# Patient Record
Sex: Female | Born: 1965 | Race: White | Hispanic: No | Marital: Married | Smoking: Never smoker
Health system: Southern US, Community
[De-identification: ages and names within clinical notes are randomized; demographics above are authoritative.]

## PROBLEM LIST (undated history)

## (undated) DIAGNOSIS — I10 Essential (primary) hypertension: Secondary | ICD-10-CM

---

## 2009-10-23 ENCOUNTER — Ambulatory Visit: Payer: Self-pay | Admitting: Family Medicine

## 2011-03-18 ENCOUNTER — Ambulatory Visit: Payer: Self-pay | Admitting: Family Medicine

## 2013-02-08 ENCOUNTER — Ambulatory Visit: Payer: Self-pay | Admitting: Internal Medicine

## 2013-06-10 DIAGNOSIS — E7800 Pure hypercholesterolemia, unspecified: Secondary | ICD-10-CM | POA: Insufficient documentation

## 2013-06-10 DIAGNOSIS — E559 Vitamin D deficiency, unspecified: Secondary | ICD-10-CM | POA: Insufficient documentation

## 2013-08-02 ENCOUNTER — Ambulatory Visit: Payer: Self-pay | Admitting: Family Medicine

## 2013-08-30 ENCOUNTER — Ambulatory Visit: Payer: Self-pay | Admitting: Physician Assistant

## 2013-08-30 LAB — BASIC METABOLIC PANEL
ANION GAP: 9 (ref 7–16)
BUN: 13 mg/dL (ref 7–18)
CHLORIDE: 104 mmol/L (ref 98–107)
CREATININE: 0.83 mg/dL (ref 0.60–1.30)
Calcium, Total: 9.3 mg/dL (ref 8.5–10.1)
Co2: 26 mmol/L (ref 21–32)
EGFR (African American): >60
Glucose: 104 mg/dL — ABNORMAL HIGH (ref 65–99)
OSMOLALITY: 278 (ref 275–301)
Potassium: 4.1 mmol/L (ref 3.5–5.1)
Sodium: 139 mmol/L (ref 136–145)

## 2016-04-29 ENCOUNTER — Other Ambulatory Visit: Payer: Self-pay | Admitting: Family Medicine

## 2016-04-29 DIAGNOSIS — Z1231 Encounter for screening mammogram for malignant neoplasm of breast: Secondary | ICD-10-CM

## 2016-05-06 ENCOUNTER — Ambulatory Visit
Admission: RE | Admit: 2016-05-06 | Discharge: 2016-05-06 | Disposition: A | Discharge status: Home / Self Care | Payer: Managed Care, Other (non HMO) | Source: Ambulatory Visit | Attending: Family Medicine | Admitting: Family Medicine

## 2016-05-06 ENCOUNTER — Encounter: Payer: Self-pay | Admitting: Radiology

## 2016-05-06 DIAGNOSIS — Z1231 Encounter for screening mammogram for malignant neoplasm of breast: Secondary | ICD-10-CM | POA: Insufficient documentation

## 2017-05-28 ENCOUNTER — Other Ambulatory Visit: Payer: Self-pay | Admitting: Family Medicine

## 2017-05-28 DIAGNOSIS — Z1231 Encounter for screening mammogram for malignant neoplasm of breast: Secondary | ICD-10-CM

## 2017-06-18 ENCOUNTER — Ambulatory Visit
Admission: RE | Admit: 2017-06-18 | Discharge: 2017-06-18 | Disposition: A | Discharge status: Home / Self Care | Payer: Managed Care, Other (non HMO) | Source: Ambulatory Visit | Attending: Family Medicine | Admitting: Family Medicine

## 2017-06-18 DIAGNOSIS — Z1231 Encounter for screening mammogram for malignant neoplasm of breast: Secondary | ICD-10-CM | POA: Insufficient documentation

## 2017-06-23 ENCOUNTER — Other Ambulatory Visit: Payer: Self-pay | Admitting: Family Medicine

## 2017-06-23 DIAGNOSIS — R928 Other abnormal and inconclusive findings on diagnostic imaging of breast: Secondary | ICD-10-CM

## 2017-06-23 DIAGNOSIS — N6310 Unspecified lump in the right breast, unspecified quadrant: Secondary | ICD-10-CM

## 2017-06-26 ENCOUNTER — Ambulatory Visit: Payer: Managed Care, Other (non HMO)

## 2017-06-27 DIAGNOSIS — I10 Essential (primary) hypertension: Secondary | ICD-10-CM | POA: Insufficient documentation

## 2017-07-02 ENCOUNTER — Ambulatory Visit
Admission: RE | Admit: 2017-07-02 | Discharge: 2017-07-02 | Disposition: A | Discharge status: Home / Self Care | Payer: Managed Care, Other (non HMO) | Source: Ambulatory Visit | Attending: Family Medicine | Admitting: Family Medicine

## 2017-07-02 ENCOUNTER — Ambulatory Visit: Payer: Managed Care, Other (non HMO)

## 2017-07-02 ENCOUNTER — Other Ambulatory Visit: Payer: Managed Care, Other (non HMO)

## 2017-07-02 DIAGNOSIS — N6311 Unspecified lump in the right breast, upper outer quadrant: Secondary | ICD-10-CM | POA: Diagnosis not present

## 2017-07-02 DIAGNOSIS — N6310 Unspecified lump in the right breast, unspecified quadrant: Secondary | ICD-10-CM

## 2017-07-02 DIAGNOSIS — R928 Other abnormal and inconclusive findings on diagnostic imaging of breast: Secondary | ICD-10-CM | POA: Insufficient documentation

## 2017-08-07 ENCOUNTER — Other Ambulatory Visit: Payer: Self-pay

## 2017-08-07 ENCOUNTER — Encounter: Payer: Self-pay | Admitting: Emergency Medicine

## 2017-08-07 ENCOUNTER — Ambulatory Visit
Admission: EM | Admit: 2017-08-07 | Discharge: 2017-08-07 | Disposition: A | Discharge status: Home / Self Care | Payer: Managed Care, Other (non HMO) | Attending: Family Medicine | Admitting: Family Medicine

## 2017-08-07 DIAGNOSIS — R21 Rash and other nonspecific skin eruption: Secondary | ICD-10-CM | POA: Diagnosis not present

## 2017-08-07 HISTORY — DX: Essential (primary) hypertension: I10

## 2017-08-07 MED ORDER — DOXYCYCLINE HYCLATE 100 MG PO CAPS: 100.0000 mg | ORAL_CAPSULE | Freq: Two times a day (BID) | ORAL | 0 refills | Status: DC

## 2017-08-07 MED ORDER — MUPIROCIN 2 % EX OINT: 1.0000 "application " | TOPICAL_OINTMENT | Freq: Two times a day (BID) | CUTANEOUS | 0 refills | Status: AC

## 2017-08-07 MED ORDER — MUPIROCIN 2 % EX OINT: 1.0000 "application " | TOPICAL_OINTMENT | Freq: Two times a day (BID) | CUTANEOUS | 0 refills | Status: DC

## 2017-08-07 NOTE — ED Triage Notes (Signed)
Patient states that she was peeling shrimp 2 days ago a poked her left thumb with the shell of the shrimp.  Patient states today that she noticed a red rash on her left thumb.  Patient denies pain or itching.

## 2017-08-07 NOTE — Discharge Instructions (Signed)
This appears to be a local reaction.  Could be early cellulitis.  Apply the topical agent. If no improvement, start the Doxy.  Take care  Dr. Adriana Simasook

## 2017-08-07 NOTE — ED Provider Notes (Signed)
[facility]    CSN: [id number] Arrival date & time: 08/07/17  1942   History   Chief Complaint Chief Complaint  Patient presents with  . Rash   HPI  52 year old female presents with the above complaint.  Patient reports that 2 days ago she was peeling shrimp.  She states that she inadvertently poked her left thumb with some portion of the shrimp.  She states that earlier today, she noticed redness of the thumb at the site.  No drainage.  She denies any warmth or pain.  She states that her husband thought she should be evaluated.  Additionally, her dentist thought that she should be evaluated for potential cellulitis.  No fevers or chills.  No known exacerbating relieving factors.  No other complaints at this time.  PMH: Essential hypertension 06/27/2017  Vitamin D deficiency, unspecified 06/10/2013  Pure hypercholesterolemia    Past Surgical History:  Procedure Laterality Date  . CESAREAN SECTION     OB History    No data available     Family History Family History  Problem Relation Age of Onset  . Breast cancer Paternal Aunt 1750       2 pat aunts   Social History Social History   Tobacco Use  . Smoking status: Never Smoker  . Smokeless tobacco: Never Used  Substance Use Topics  . Alcohol use: Yes  . Drug use: Not on file    Allergies   Codeine  Review of Systems Review of Systems  Constitutional: Negative.   Skin: Positive for rash.       Warmth, no pain.   Physical Exam Triage Vital Signs ED Triage Vitals  Enc Vitals Group     BP 08/07/17 1957 133/84     Pulse Rate 08/07/17 1957 67     Resp --      Temp 08/07/17 1957 97.7 F (36.5 C)     Temp Source 08/07/17 1957 Oral     SpO2 08/07/17 1957 100 %     Weight 08/07/17 1953 155 lb (70.3 kg)     Height 08/07/17 1953 5\' 6"  (1.676 m)     Head Circumference --      Peak Flow --      Pain Score 08/07/17 1954 0     Pain Loc --      Pain Edu? --      Excl. in [place]? --    Updated Vital  Signs BP 133/84 (BP Location: Left Arm)   Pulse 67   Temp 97.7 F (36.5 C) (Oral)   Ht 5\' 6"  (1.676 m)   Wt 155 lb (70.3 kg)   LMP 07/31/2017   SpO2 100%   BMI 25.02 kg/m      Physical Exam  Constitutional: She is oriented to person, place, and time. She appears well-developed and well-nourished. No distress.  Pulmonary/Chest: Effort normal. No respiratory distress.  Musculoskeletal:  Left thumb: Normal range of motion.  Erythema noted.  No edema.  No tenderness.  Neurological: She is alert and oriented to person, place, and time.  Skin:  Left thumb -small punctate area noted from injury.  Mild surrounding erythema.  No blanching.  No warmth.  No tenderness.  No drainage.  Psychiatric: She has a normal mood and affect. Her behavior is normal.  Nursing note and vitals reviewed.  UC Treatments / Results  Labs (all labs ordered are listed, but only abnormal results are displayed) Labs Reviewed - No data to display  EKG  EKG Interpretation None       Radiology No results found.  Procedures Procedures (including critical care time)  Medications Ordered in UC Medications - No data to display   Initial Impression / Assessment and Plan / UC Course  I have reviewed the triage vital signs and the nursing notes.  Pertinent labs & imaging results that were available during my care of the patient were reviewed by me and considered in my medical decision making (see chart for details).     52 year old female presents with what appears to be a local reaction.  She is concerned about cellulitis.  Trial of Bactroban ointment.  If no improvement, she can proceed with doxycycline that was given.  Final Clinical Impressions(s) / UC Diagnoses   Final diagnoses:  Rash    ED Discharge Orders        Ordered    mupirocin ointment (BACTROBAN) 2 %  2 times daily,   Status:  Discontinued     08/07/17 2005    doxycycline (VIBRAMYCIN) 100 MG capsule  2 times daily     08/07/17  2005    mupirocin ointment (BACTROBAN) 2 %  2 times daily     08/07/17 2007     Controlled Substance Prescriptions [place] Controlled Substance Registry consulted? Not Applicable   Tommie Sams, DO 08/07/17 2013

## 2017-08-10 ENCOUNTER — Telehealth: Payer: Self-pay

## 2017-08-10 NOTE — Telephone Encounter (Signed)
Called to follow up with patient since [facility]. Patient reports resolvement of rash.  Patient instructed to call back with any questions or concerns. [facility]

## 2017-12-01 ENCOUNTER — Other Ambulatory Visit: Payer: Self-pay | Admitting: Family Medicine

## 2017-12-01 DIAGNOSIS — R928 Other abnormal and inconclusive findings on diagnostic imaging of breast: Secondary | ICD-10-CM

## 2018-01-05 ENCOUNTER — Ambulatory Visit
Admission: RE | Admit: 2018-01-05 | Discharge: 2018-01-05 | Disposition: A | Discharge status: Home / Self Care | Payer: Managed Care, Other (non HMO) | Source: Ambulatory Visit | Attending: Family Medicine | Admitting: Family Medicine

## 2018-01-05 DIAGNOSIS — R928 Other abnormal and inconclusive findings on diagnostic imaging of breast: Secondary | ICD-10-CM | POA: Insufficient documentation

## 2018-01-19 ENCOUNTER — Other Ambulatory Visit: Payer: Self-pay | Admitting: Family Medicine

## 2018-01-19 DIAGNOSIS — R928 Other abnormal and inconclusive findings on diagnostic imaging of breast: Secondary | ICD-10-CM

## 2018-07-01 ENCOUNTER — Other Ambulatory Visit: Payer: Self-pay | Admitting: Family Medicine

## 2018-07-01 DIAGNOSIS — R928 Other abnormal and inconclusive findings on diagnostic imaging of breast: Secondary | ICD-10-CM

## 2018-08-17 ENCOUNTER — Ambulatory Visit
Admission: RE | Admit: 2018-08-17 | Discharge: 2018-08-17 | Disposition: A | Discharge status: Home / Self Care | Payer: Managed Care, Other (non HMO) | Source: Ambulatory Visit | Attending: Family Medicine | Admitting: Family Medicine

## 2018-08-17 DIAGNOSIS — R928 Other abnormal and inconclusive findings on diagnostic imaging of breast: Secondary | ICD-10-CM

## 2019-01-18 IMAGING — US US BREAST*R* LIMITED INC AXILLA
1 series · 5 of 5 positions shown · non-contrast
Comparison: Previous exam(s).

CLINICAL DATA: 51-year-old female recalled from screening mammogram
dated 06/18/2017 for a possible right breast mass.

EXAM:
2D DIGITAL DIAGNOSTIC RIGHT MAMMOGRAM WITH CAD AND ADJUNCT TOMO
ULTRASOUND RIGHT BREAST

[Series 1: us breast*right* limited inc axilla · 0.06mm/px · 5 of 5 slices shown]
[im 1/5]
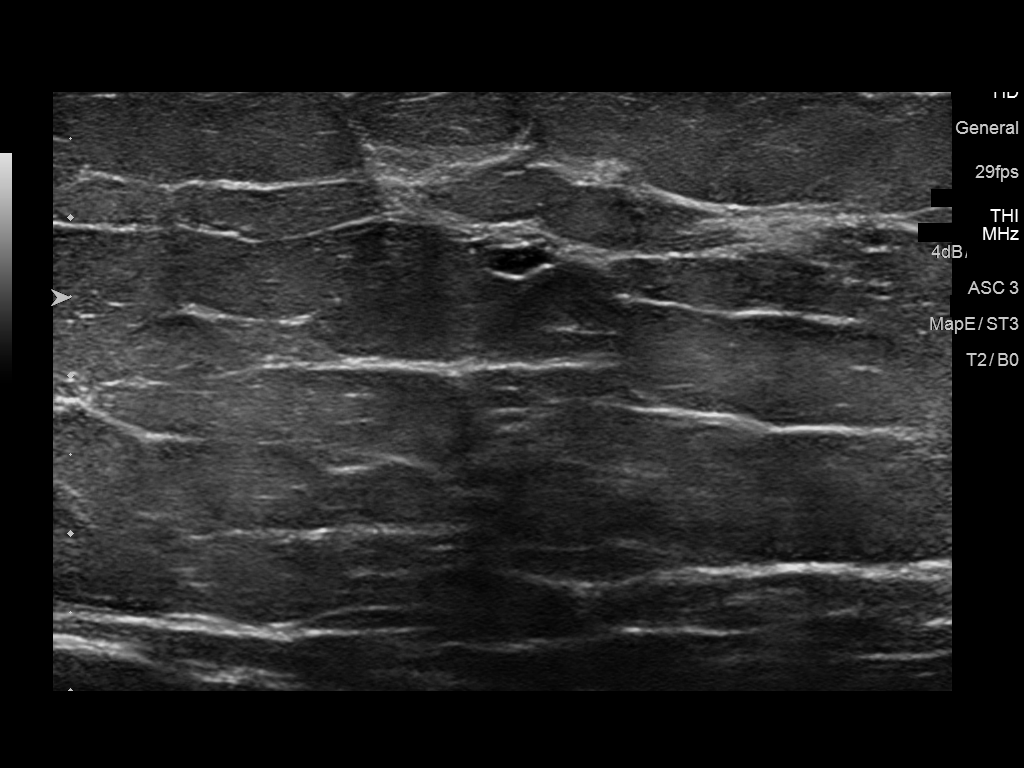
[im 2/5]
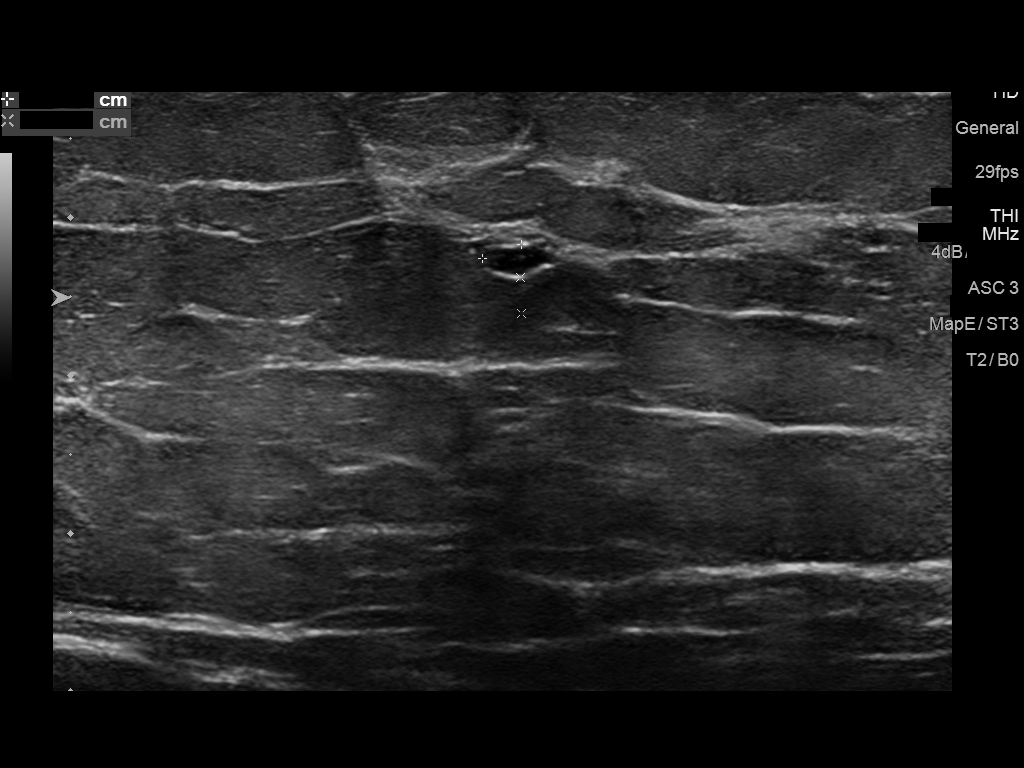
[im 3/5]
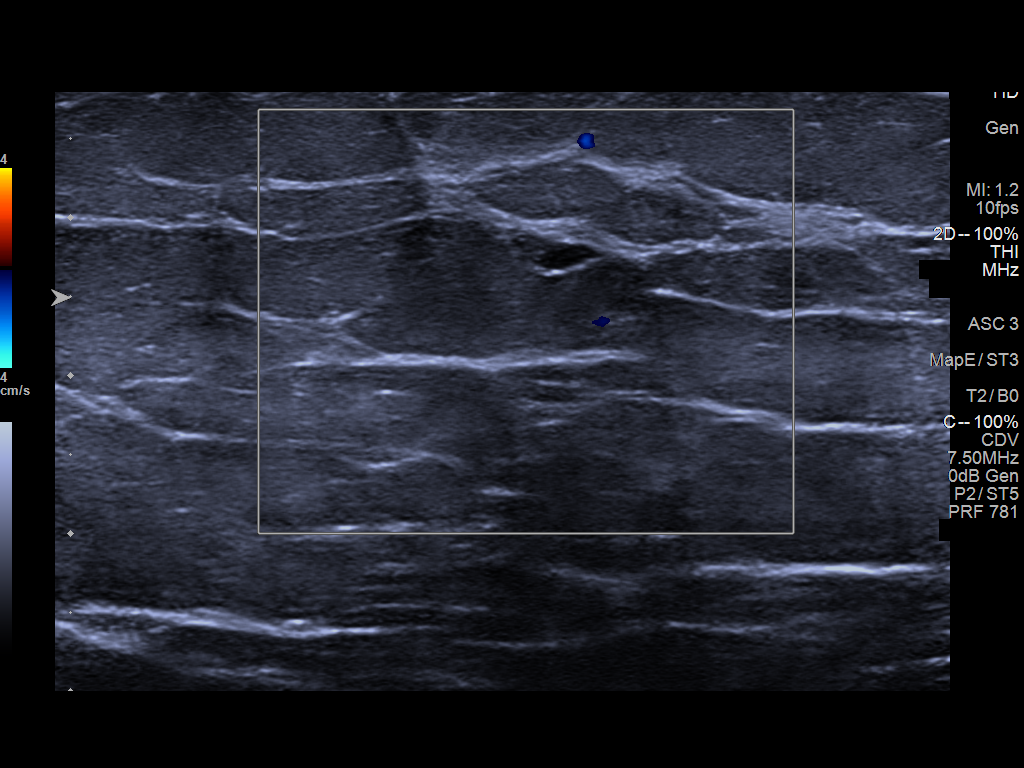
[im 4/5]
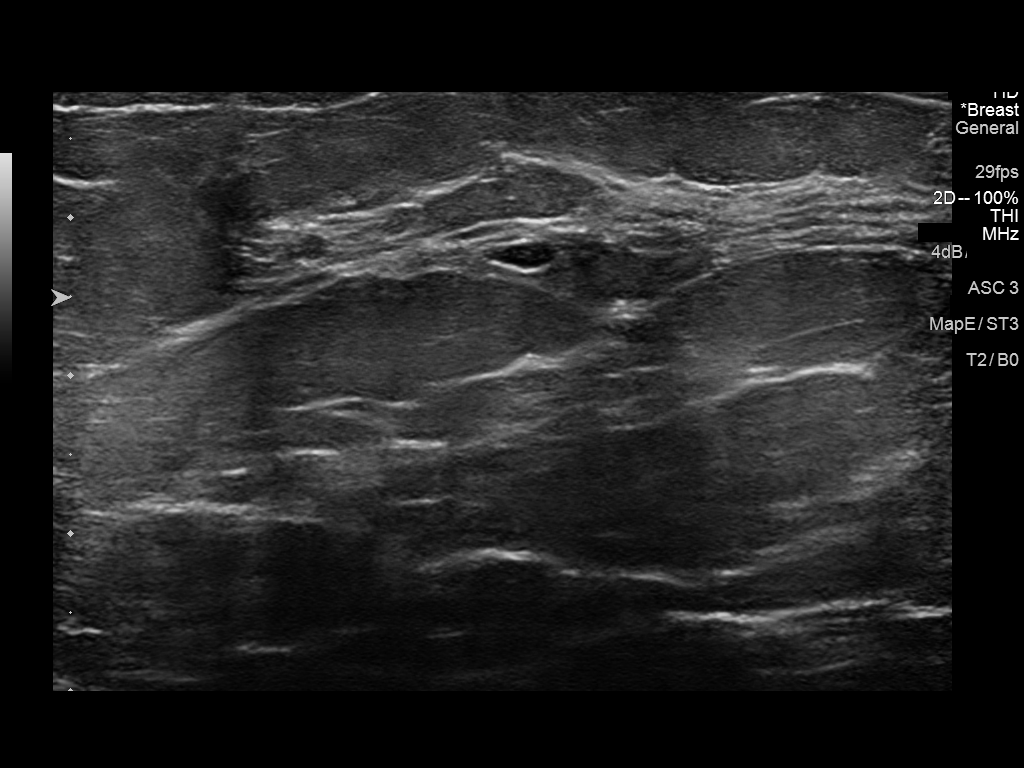
[im 5/5]
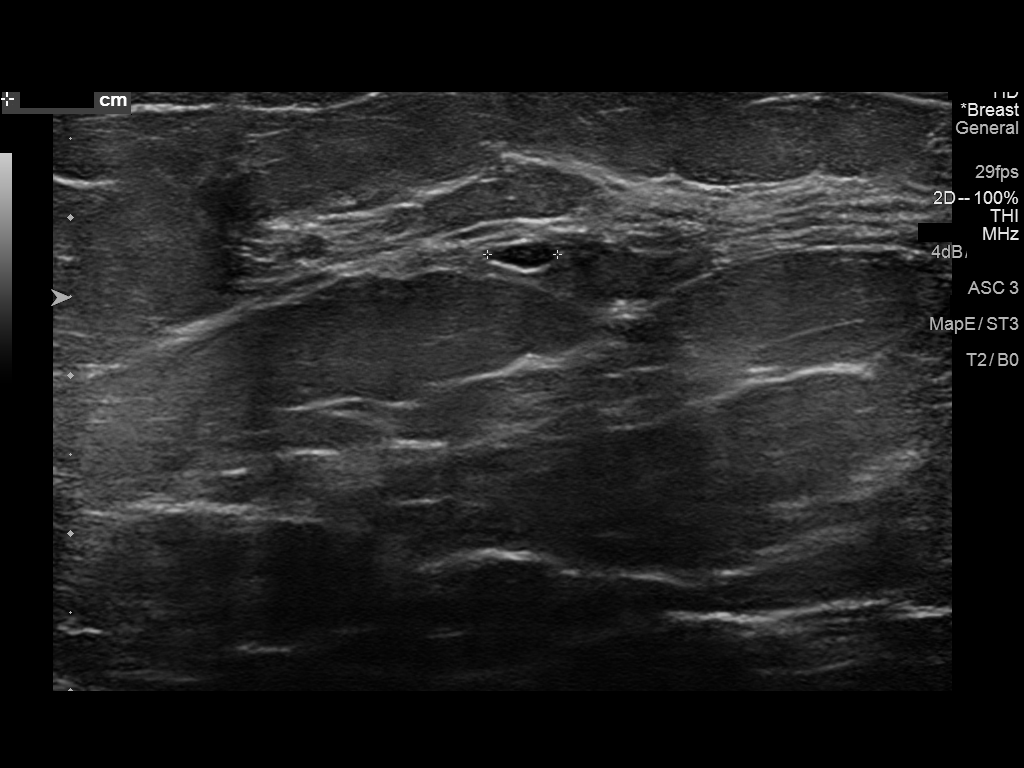

[5 of 5 positions shown; findings below may reference images not displayed]

ACR Breast Density Category b: There are scattered areas of
fibroglandular density.
FINDINGS: Previously described mass in the upper outer right breast at middle
depth persists on today's additional 3D views. It appears
circumscribed with gentle lobulations and is equal in density. Other
stable similar-appearing masses are identified. Further evaluation
with ultrasound was performed.

Mammographic images were processed with CAD.

Targeted ultrasound is performed, showing a circumscribed nearly
anechoic oval mass at the [DATE] position 4 cm from the nipple. It
measures 0.4 x 0.5 x 0.3 cm. There is no significant internal
vascularity. This may correspond with the mammographic finding.
Extensive sonographic evaluation of the entire lateral right breast
was performed. Multiple other similar appearing simple and minimally
complicated cysts were identified.

Please note, sonographic images 1-3 are incorrectly labeled as
radial. These images were taken in the anti-radial orientation.
IMPRESSION: Probably benign probable right breast minimally complicated cyst.
Recommendation is for six-month mammographic and sonographic
follow-up.

RECOMMENDATION:
Diagnostic right breast mammogram and ultrasound in 6 months.

I have discussed the findings and recommendations with the patient.
Results were also provided in writing at the conclusion of the
visit. If applicable, a reminder letter will be sent to the patient
regarding the next appointment.

BI-RADS CATEGORY  3: Probably benign.

## 2019-10-18 DIAGNOSIS — Z83719 Family history of colon polyps, unspecified: Secondary | ICD-10-CM | POA: Insufficient documentation

## 2019-10-19 ENCOUNTER — Other Ambulatory Visit: Payer: Self-pay | Admitting: Family Medicine

## 2019-10-19 DIAGNOSIS — Z1231 Encounter for screening mammogram for malignant neoplasm of breast: Secondary | ICD-10-CM

## 2020-01-10 DIAGNOSIS — M7711 Lateral epicondylitis, right elbow: Secondary | ICD-10-CM | POA: Insufficient documentation

## 2020-01-10 DIAGNOSIS — G5602 Carpal tunnel syndrome, left upper limb: Secondary | ICD-10-CM | POA: Insufficient documentation

## 2020-01-10 DIAGNOSIS — G5601 Carpal tunnel syndrome, right upper limb: Secondary | ICD-10-CM | POA: Insufficient documentation

## 2020-11-18 IMAGING — US ULTRASOUND RIGHT BREAST LIMITED
1 series · 4 of 4 positions shown · non-contrast
Comparison: Previous exam(s).

CLINICAL DATA: Follow-up right breast mass

EXAM:
DIGITAL DIAGNOSTIC BILATERAL MAMMOGRAM WITH CAD AND TOMO
ULTRASOUND RIGHT BREAST

[Series 1: ultrasound right breast limited · 0.05mm/px · 4 of 4 slices shown]
[im 1/4]
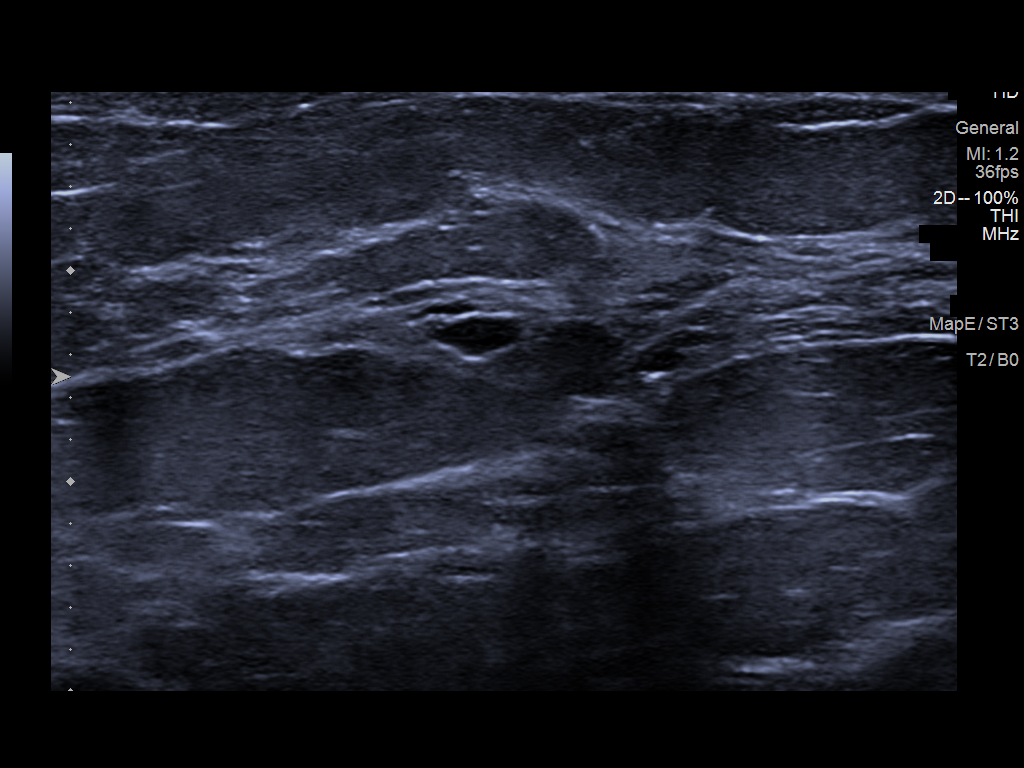
[im 2/4]
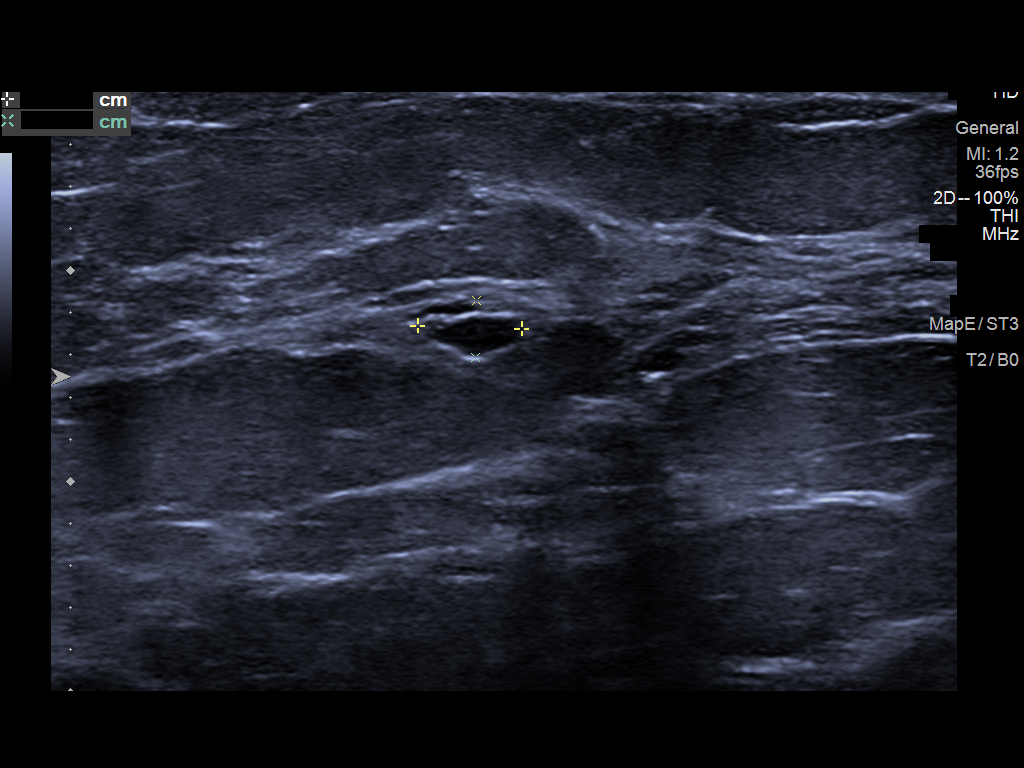
[im 3/4]
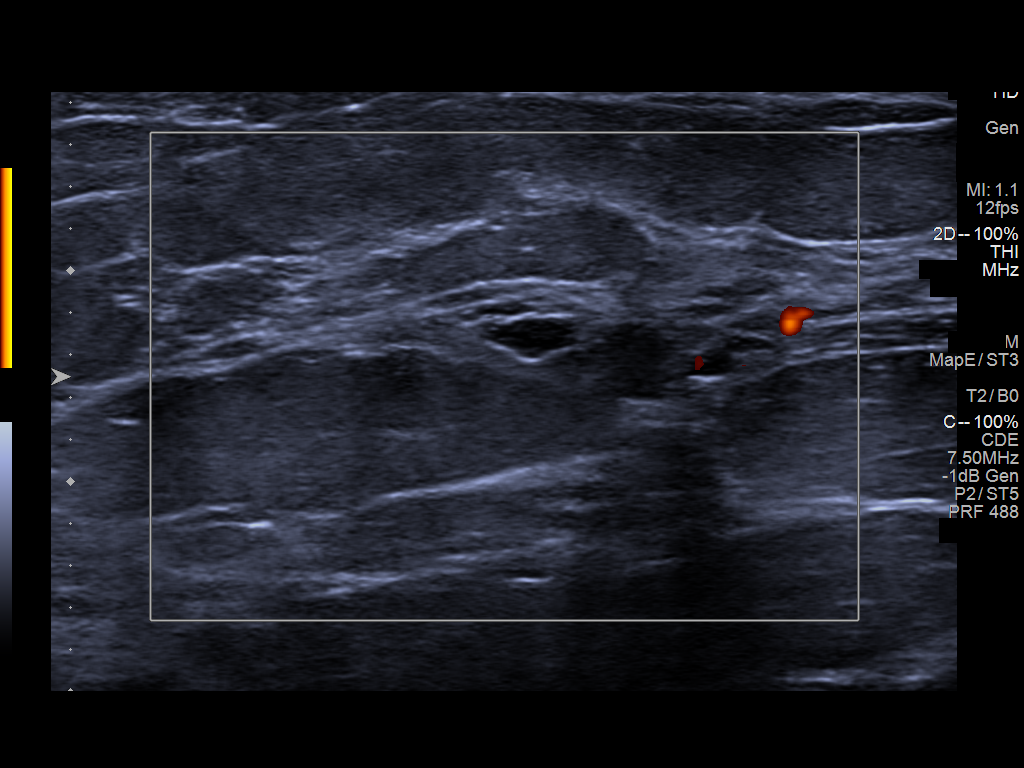
[im 4/4]
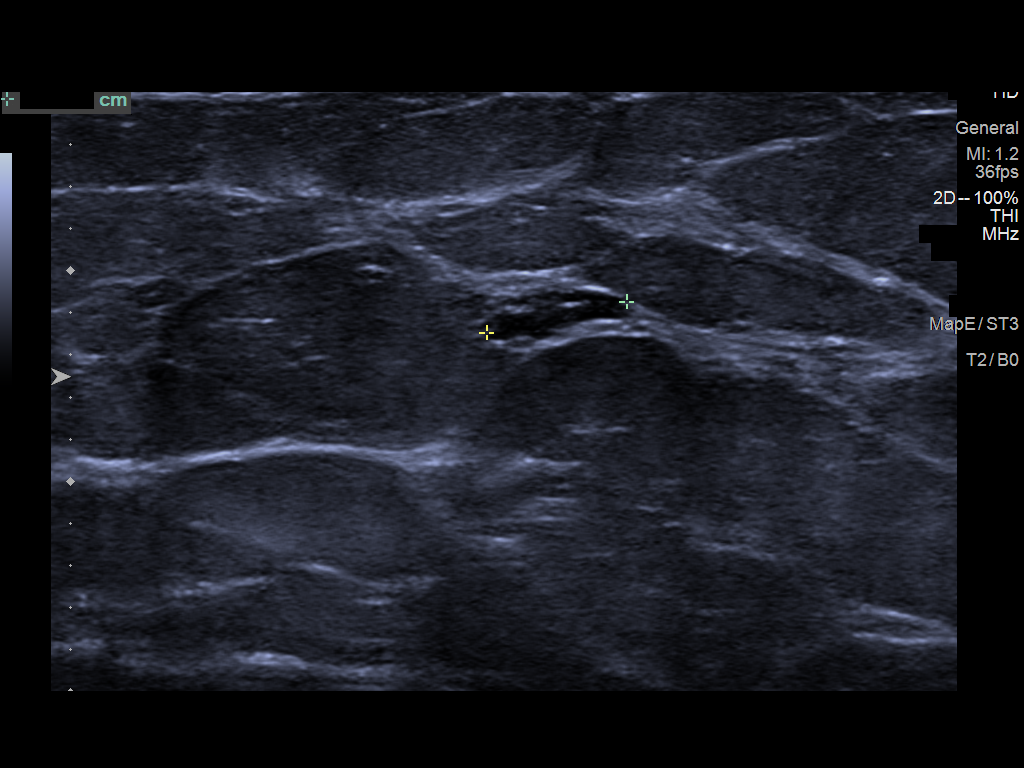

[4 of 4 positions shown; findings below may reference images not displayed]

ACR Breast Density Category b: There are scattered areas of
fibroglandular density.
FINDINGS: No suspicious masses, calcifications, or distortion seen
mammographically.

Mammographic images were processed with CAD.

On physical exam, no suspicious lumps are identified.

Targeted ultrasound is performed, showing a cluster of cysts at
[DATE], 4 cm from the nipple. While this measures a little larger in
the interval, it is clearly a benign cluster of cysts and requires
no additional follow-up.
IMPRESSION: Fibrocystic changes.  No evidence of malignancy.

RECOMMENDATION:
Annual screening mammography.

I have discussed the findings and recommendations with the patient.
Results were also provided in writing at the conclusion of the
visit. If applicable, a reminder letter will be sent to the patient
regarding the next appointment.

BI-RADS CATEGORY  2: Benign.

## 2021-12-13 DIAGNOSIS — I10 Essential (primary) hypertension: Secondary | ICD-10-CM | POA: Insufficient documentation

## 2021-12-13 DIAGNOSIS — N920 Excessive and frequent menstruation with regular cycle: Secondary | ICD-10-CM | POA: Insufficient documentation

## 2021-12-13 DIAGNOSIS — A6000 Herpesviral infection of urogenital system, unspecified: Secondary | ICD-10-CM | POA: Insufficient documentation

## 2022-05-26 ENCOUNTER — Emergency Department
Admission: EM | Admit: 2022-05-26 | Discharge: 2022-05-26 | Disposition: A | Discharge status: Home / Self Care | Payer: Managed Care, Other (non HMO) | Attending: Emergency Medicine | Admitting: Emergency Medicine

## 2022-05-26 ENCOUNTER — Encounter: Payer: Self-pay | Admitting: Intensive Care

## 2022-05-26 ENCOUNTER — Other Ambulatory Visit: Payer: Self-pay

## 2022-05-26 DIAGNOSIS — R0789 Other chest pain: Secondary | ICD-10-CM | POA: Insufficient documentation

## 2022-05-26 DIAGNOSIS — I10 Essential (primary) hypertension: Secondary | ICD-10-CM | POA: Insufficient documentation

## 2022-05-26 DIAGNOSIS — R42 Dizziness and giddiness: Secondary | ICD-10-CM | POA: Diagnosis present

## 2022-05-26 DIAGNOSIS — R55 Syncope and collapse: Secondary | ICD-10-CM | POA: Diagnosis not present

## 2022-05-26 DIAGNOSIS — R0602 Shortness of breath: Secondary | ICD-10-CM | POA: Insufficient documentation

## 2022-05-26 LAB — URINALYSIS, ROUTINE W REFLEX MICROSCOPIC
Bilirubin Urine: NEGATIVE
Glucose, UA: NEGATIVE mg/dL
Hgb urine dipstick: NEGATIVE
Ketones, ur: NEGATIVE mg/dL
Leukocytes,Ua: NEGATIVE
Nitrite: NEGATIVE
Protein, ur: NEGATIVE mg/dL
Specific Gravity, Urine: 1.002 — ABNORMAL LOW (ref 1.005–1.030)
pH: 6 (ref 5.0–8.0)

## 2022-05-26 LAB — CBC
HCT: 41.4 % (ref 36.0–46.0)
Hemoglobin: 13.2 g/dL (ref 12.0–15.0)
MCH: 25.4 pg — ABNORMAL LOW (ref 26.0–34.0)
MCHC: 31.9 g/dL (ref 30.0–36.0)
MCV: 79.6 fL — ABNORMAL LOW (ref 80.0–100.0)
Platelets: 324 10*3/uL (ref 150–400)
RBC: 5.2 MIL/uL — ABNORMAL HIGH (ref 3.87–5.11)
RDW: 14 % (ref 11.5–15.5)
WBC: 9.1 10*3/uL (ref 4.0–10.5)
nRBC: 0 % (ref 0.0–0.2)

## 2022-05-26 LAB — BASIC METABOLIC PANEL
Anion gap: 7 (ref 5–15)
BUN: 14 mg/dL (ref 6–20)
CO2: 25 mmol/L (ref 22–32)
Calcium: 10 mg/dL (ref 8.9–10.3)
Chloride: 106 mmol/L (ref 98–111)
Creatinine, Ser: 0.88 mg/dL (ref 0.44–1.00)
GFR, Estimated: >60 mL/min (ref >60)
Glucose, Bld: 106 mg/dL — ABNORMAL HIGH (ref 70–99)
Potassium: 4.1 mmol/L (ref 3.5–5.1)
Sodium: 138 mmol/L (ref 135–145)

## 2022-05-26 LAB — TROPONIN I (HIGH SENSITIVITY)
Troponin I (High Sensitivity): 2 ng/L (ref <18)
Troponin I (High Sensitivity): <2 ng/L (ref <18)

## 2022-05-26 NOTE — ED Provider Notes (Signed)
[facility] Provider Note    Event Date/Time   First MD Initiated Contact with Patient 05/26/22 1724     (approximate)   History   Chief Complaint Loss of Consciousness   HPI  Cheyenne Church is a 56 y.o. female with past medical history of hypertension who presents to the ED complaining of syncope.  Patient reports that she was at a bridal shower earlier this afternoon when she started feeling very dizzy and lightheaded, like she was going to pass out.  She went to the bathroom to sit down, however symptoms continue to worsen and she laid down on the ground.  Symptoms seem to briefly improve, but when she went to stand up and walk out of the bathroom, she eventually lost consciousness.  She states that a bystander was able to catch her and help lower her to the ground, denies hitting her head.  She states that she had some tightness in her chest with some mild difficulty breathing with the episode, however this has since resolved.  She states that she has felt similar dizziness and lightheadedness in the past, but has never passed out.  She has otherwise been feeling well recently with no fevers, cough, nausea, vomiting, or diarrhea.     Physical Exam   Triage Vital Signs: ED Triage Vitals  Enc Vitals Group     BP 05/26/22 1525 132/89     Pulse Rate 05/26/22 1525 92     Resp 05/26/22 1525 16     Temp 05/26/22 1525 98.5 F (36.9 C)     Temp Source 05/26/22 1525 Oral     SpO2 05/26/22 1525 99 %     Weight 05/26/22 1529 160 lb (72.6 kg)     Height 05/26/22 1529 5\' 7"  (1.702 m)     Head Circumference --      Peak Flow --      Pain Score 05/26/22 1526 3     Pain Loc --      Pain Edu? --      Excl. in [place]? --     Most recent vital signs: Vitals:   05/26/22 1525  BP: 132/89  Pulse: 92  Resp: 16  Temp: 98.5 F (36.9 C)  SpO2: 99%    Constitutional: Alert and oriented. Eyes: Conjunctivae are normal. Head: Atraumatic. Nose: No  congestion/rhinnorhea. Mouth/Throat: Mucous membranes are moist.  Cardiovascular: Normal rate, regular rhythm. Grossly normal heart sounds.  2+ radial pulses bilaterally. Respiratory: Normal respiratory effort.  No retractions. Lungs CTAB. Gastrointestinal: Soft and nontender. No distention. Musculoskeletal: No lower extremity tenderness nor edema.  Neurologic:  Normal speech and language. No gross focal neurologic deficits are appreciated.    ED Results / Procedures / Treatments   Labs (all labs ordered are listed, but only abnormal results are displayed) Labs Reviewed  BASIC METABOLIC PANEL - Abnormal; Notable for the following components:      Result Value   Glucose, Bld 106 (*)    All other components within normal limits  CBC - Abnormal; Notable for the following components:   RBC 5.20 (*)    MCV 79.6 (*)    MCH 25.4 (*)    All other components within normal limits  URINALYSIS, ROUTINE W REFLEX MICROSCOPIC - Abnormal; Notable for the following components:   Color, Urine COLORLESS (*)    APPearance CLEAR (*)    Specific Gravity, Urine 1.002 (*)    All other components within normal limits  TROPONIN  I (HIGH SENSITIVITY)  TROPONIN I (HIGH SENSITIVITY)     EKG  ED ECG REPORT I, Chesley Noon, the attending physician, personally viewed and interpreted this ECG.   Date: 05/26/2022  EKG Time: 15:35  Rate: 89  Rhythm: normal sinus rhythm  Axis: Normal  Intervals:none  ST&T Change: None  PROCEDURES:  Critical Care performed: No  Procedures   MEDICATIONS ORDERED IN ED: Medications - No data to display   IMPRESSION / MDM / ASSESSMENT AND PLAN / ED COURSE  I reviewed the triage vital signs and the nursing notes.                              56 y.o. female with past medical history of hypertension who presents to the ED complaining of syncopal episode preceded by dizziness and lightheadedness and associated with some chest tightness and shortness of  breath.  Patient's presentation is most consistent with acute presentation with potential threat to life or bodily function.  Differential diagnosis includes, but is not limited to, arrhythmia, ACS, PE, electrolyte abnormality, AKI, anemia.  Patient nontoxic-appearing and in no acute distress, vital signs are unremarkable.  Patient is asymptomatic at the time of my evaluation, her chest tightness and difficulty breathing has resolved.  EKG shows no evidence of arrhythmia or ischemia and symptoms seem atypical for cardiac etiology of syncope, patient without any apparent risk factors as well.  Initial troponin is unremarkable and we will observe on cardiac monitor, repeat troponin.  I doubt PE given resolution of symptoms with reassuring vital signs.  Remainder of labs are reassuring with no significant anemia, leukocytosis, electrolyte abnormality, or AKI.  Patient is postmenopausal.  Repeat troponin within normal limits, no events noted on cardiac monitor.  Patient is appropriate for discharge home with PCP follow-up for suspected vasovagal episode.  She was counseled to return to the ED for any new or worsening symptoms, patient and spouse agree with plan.      FINAL CLINICAL IMPRESSION(S) / ED DIAGNOSES   Final diagnoses:  Syncope, unspecified syncope type     Rx / DC Orders   ED Discharge Orders     None        Note:  This document was prepared using Dragon voice recognition software and may include unintentional dictation errors.   Chesley Noon, MD 05/26/22 Ernestina [place]

## 2022-05-26 NOTE — ED Triage Notes (Signed)
Pt presents via EMS c/o syncopal episode at bridal event witnessed. Reports felt presyncopal prior to event. Denies injury. Reports some SOB and chest pressure. Orthostatic + per EMS.

## 2022-05-26 NOTE — ED Provider Triage Note (Signed)
Emergency Medicine Provider Triage Evaluation Note  [name] , a 56 y.o. female  was evaluated in triage.  Pt complains of couple episode.  Patient was at a [employer], when she began to experience some presyncopal episodes.  She went to the bathroom, later self down, and when she got up she came out to find her friends that she felt faint.  She apparently passed out the witnessed syncopal episode lasting only a few seconds.  Patient denies any outright head injury or other injury.  She reports some mild headache at this time and some chest pressure.  Review of Systems  Positive: Syncope Negative: N/V  Physical Exam  BP 132/89 (BP Location: Right Arm)   Pulse 92   Temp 98.5 F (36.9 C) (Oral)   Resp 16   Ht 5\' 7"  (1.702 m)   Wt 72.6 kg   SpO2 99%   BMI 25.06 kg/m  Gen:   Awake, no distress NAD Resp:  Normal effort CTA MSK:   Moves extremities without difficulty  CVS:  RRR  Medical Decision Making  Medically screening exam initiated at 3:38 PM.  Appropriate orders placed.  [name] was informed that the remainder of the evaluation will be completed by another provider, this initial triage assessment does not replace that evaluation, and the importance of remaining in the ED until their evaluation is complete.  Patient to the ED for evaluation of syncopal episode prior to arrival.  She denies any low blood sugar but does give a history of syncopal episodes in the past.   [name], PA-C 05/26/22 1541

## 2022-11-08 DIAGNOSIS — Z87898 Personal history of other specified conditions: Secondary | ICD-10-CM | POA: Insufficient documentation

## 2022-11-08 DIAGNOSIS — M5412 Radiculopathy, cervical region: Secondary | ICD-10-CM | POA: Insufficient documentation

## 2022-11-08 DIAGNOSIS — M5431 Sciatica, right side: Secondary | ICD-10-CM | POA: Insufficient documentation

## 2022-11-10 DIAGNOSIS — R7303 Prediabetes: Secondary | ICD-10-CM | POA: Insufficient documentation

## 2022-11-22 LAB — COLOGUARD: COLOGUARD: NEGATIVE

## 2022-11-22 LAB — EXTERNAL GENERIC LAB PROCEDURE: COLOGUARD: NEGATIVE

## 2023-01-01 ENCOUNTER — Ambulatory Visit
Admission: RE | Admit: 2023-01-01 | Discharge: 2023-01-01 | Disposition: A | Discharge status: Home / Self Care | Payer: Managed Care, Other (non HMO) | Source: Ambulatory Visit | Attending: Family Medicine | Admitting: Family Medicine

## 2023-01-01 ENCOUNTER — Other Ambulatory Visit: Payer: Self-pay | Admitting: Family Medicine

## 2023-01-01 ENCOUNTER — Ambulatory Visit
Admission: RE | Admit: 2023-01-01 | Discharge: 2023-01-01 | Disposition: A | Discharge status: Home / Self Care | Payer: Managed Care, Other (non HMO) | Attending: Family Medicine | Admitting: Family Medicine

## 2023-01-01 DIAGNOSIS — R0600 Dyspnea, unspecified: Secondary | ICD-10-CM

## 2023-04-22 NOTE — Progress Notes (Unsigned)
New Patient Note  RE: Cheyenne Church MRN: [id number] DOB: 07/28/1965 Date of Office Visit: 04/23/2023  Consult requested by: Dione Housekeeper, * Primary care provider: Leim Fabry, MD  Chief Complaint: Urticaria (Uses scent free creams - not sure the cause )  History of Present Illness: I had the pleasure of seeing Cheyenne Church for initial evaluation at the [facility] on 04/23/2023. Cheyenne Church is a 57 y.o. female, who is referred here by Dr. Rolin Barry (PCP)  for the evaluation of rash.  Discussed the use of AI scribe software for clinical note transcription with the patient, who gave verbal consent to proceed.  The patient presents with a persistent, itchy rash that began around February. The rash, characterized by bumps, is primarily located on the arms, with one instance on the stomach. The patient reports a history of itching in specific areas on the arms for approximately five years prior to the appearance of the rash. The itching is particularly severe at night, often disrupting sleep. The patient has been using hydroxyzine at night to manage the itching and a prescribed clobetasol cream twice daily. The cream has resulted in a reduction in the size of the bumps, but they persist. The rash appears to move around, with new bumps appearing and lasting for several weeks at a time.  The patient has a history of severe allergies, presenting with year-round, but has not seen an allergist.   The patient has a seven-year-old dog which has been in the home for approximately the same duration as the onset of the initial itching on the arms. The patient has not had any recent tick bites and has not seen a dermatologist for this issue  The patient also reports potential lactose intolerance, with symptoms of bloating, stomach cramps, and diarrhea after consuming certain dairy products.   The patient has other medical conditions including prediabetes, high cholesterol,  and a recent change in blood pressure medication. The patient also reports a high red blood cell count, which was attributed to potential dehydration. The patient is up to date with physical exams, mammograms, and has completed a Cologuard test.     Assessment and Plan: Cheyenne Church is a 57 y.o. female with: Rash and other nonspecific skin eruption Pruritus Pruritus for approximately 7 years with new onset of rash since February. The rash is characterized by itchy bumps that persist for weeks and move around. The rash has been partially responsive to Clobetasol cream and Hydroxyzine. No clear etiology identified yet. Etiology unclear. Today's rash is not hives. May take hydroxyzine 10mg  three times per day as needed for itching. Use clobetasol sparingly as needed. Do not use on the face, neck, armpits or groin area. Do not use more than 3 weeks in a row.  Avoid the following potential triggers: alcohol, tight clothing, NSAIDs, hot showers and getting overheated. See below for proper skin care. Use fragrance free and dye free products. No dryer sheets or fabric softener.   Get bloodwork. Recommend dermatology evaluation as well. Stop taking collagen supplement.   Other allergic rhinitis Perennial symptoms. No prior allergy testing. Start environmental control measures as below for pet dander.  Return for allergy testing depending on bloodwork results.  Lactose intolerance May use lactose free milk or take a lactaid pill right before consuming anything with dairy.  Return in about 3 months (around 07/24/2023).  Meds ordered this encounter  Medications   hydrOXYzine (ATARAX) 10 MG tablet    Sig: Take  1 tablet (10 mg total) by mouth 3 (three) times daily as needed for itching.    Dispense:  60 tablet    Refill:  1   Lab Orders         Alpha-Gal Panel         ANA w/Reflex         CBC with Differential/Platelet         Comprehensive metabolic panel         C-reactive protein          Tryptase         Thyroid Cascade Profile         Sedimentation rate      Other allergy screening: Asthma: no Rhino conjunctivitis: yes Food allergy: no Medication allergy: yes Hymenoptera allergy: no History of recurrent infections suggestive of immunodeficency: no  Diagnostics: None.  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   Menorrhagia 12/13/2021   Carpal tunnel syndrome, left 01/10/2020   Carpal tunnel syndrome, right 01/10/2020   Lateral epicondylitis, right elbow 01/10/2020   Family history of colonic polyps 10/18/2019   Essential hypertension 06/27/2017   Pure hypercholesterolemia 06/10/2013   Vitamin D deficiency 06/10/2013   Past Medical History:  Diagnosis Date   Hypertension    Past Surgical History: Past Surgical History:  Procedure Laterality Date   CESAREAN SECTION     Medication List:  Current Outpatient Medications  Medication Sig Dispense Refill   clobetasol cream (TEMOVATE) 0.05 % Apply 1 Application topically 2 (two) times daily.     hydrOXYzine (ATARAX) 10 MG tablet Take 1 tablet (10 mg total) by mouth 3 (three) times daily as needed for itching. 60 tablet 1   meloxicam (MOBIC) 15 MG tablet Take 15 mg by mouth daily.     Multiple Vitamin (MULTIVITAMIN) tablet Take 1 tablet by mouth daily.     olmesartan (BENICAR) 20 MG tablet Take 20 mg by mouth daily.     Omega-3 Fatty Acids (FISH OIL PO) Take 1 capsule by mouth daily.     No current facility-administered medications for this visit.   Allergies: Allergies  Allergen Reactions   Erythromycin Other (See Comments)    Years ago- felt like heartburn   Codeine Itching and Other (See Comments)    Itchy   Social History: Social History   Socioeconomic History    Marital status: Married    Spouse name: Not on file   Number of children: Not on file   Years of education: Not on file   Highest education level: Not on file  Occupational History   Not on file  Tobacco Use   Smoking status: Never    Passive exposure: Never   Smokeless tobacco: Never  Vaping Use   Vaping status: Never Used  Substance and Sexual Activity   Alcohol use: Yes   Drug use: Never   Sexual activity: Not on file  Other Topics Concern   Not on file  Social History Narrative   Not on file   Social Determinants of Health   Financial Resource Strain: Low Risk  (11/08/2022)   Received from [facility], [facility]   Overall Financial Resource Strain (CARDIA)    Difficulty of Paying Living Expenses: Not  hard at all  Food Insecurity: No Food Insecurity (11/08/2022)   Received from [facility], [facility]   Hunger Vital Sign    Worried About Running Out of Food in the Last Year: Never true    Ran Out of Food in the Last Year: Never true  Transportation Needs: No Transportation Needs (11/08/2022)   Received from [facility], [facility]   [facility] - Transportation    In the past 12 months, has lack of transportation kept you from medical appointments or from getting medications?: No    Lack of Transportation (Non-Medical): No  Physical Activity: Not on file  Stress: Not on file  Social Connections: Not on file   Lives in a 55 year old house. Smoking: denies Occupation: Environmental health practitioner HistorySurveyor, minerals in the house: no Engineer, civil (consulting) in the family room: no Carpet in the bedroom: no Heating: gas Cooling: central Pet: yes 1 dog x 7 yrs  Family History: Family History  Problem Relation Age of Onset   Breast cancer Paternal Aunt 50       2 pat aunts   Problem                               Relation Asthma                                    no Eczema                                no Food allergy                          no Allergic rhino conjunctivitis     son   Review of Systems  Constitutional:  Negative for appetite change, chills, fever and unexpected weight change.  HENT:  Negative for congestion and rhinorrhea.   Eyes:  Negative for itching.  Respiratory:  Negative for cough, chest tightness, shortness of breath and wheezing.   Cardiovascular:  Negative for chest pain.  Gastrointestinal:  Negative for abdominal pain.  Genitourinary:  Negative for difficulty urinating.  Skin:  Positive for rash.  Neurological:  Negative for headaches.    Objective: BP 120/70   Pulse 84   Temp 98.4 F (36.9 C)   Resp 18   Ht 5' 5.75" (1.67 m)   Wt 172 lb 6.4 oz (78.2 kg)   SpO2 97%   BMI 28.04 kg/m  Body mass index is 28.04 kg/m. Physical Exam Vitals and nursing note reviewed.  Constitutional:      Appearance: Normal appearance. Cheyenne Church is well-developed.  HENT:     Head: Normocephalic and atraumatic.     Right Ear: Tympanic membrane and external ear normal.     Left Ear: Tympanic membrane and external ear normal.     Nose: Nose normal.     Mouth/Throat:     Mouth: Mucous membranes are moist.     Pharynx: Oropharynx is clear.  Eyes:     Conjunctiva/sclera: Conjunctivae normal.  Cardiovascular:     Rate and Rhythm: Normal rate and regular rhythm.     Heart sounds: Normal heart sounds. No murmur heard.    No friction rub. No gallop.  Pulmonary:  Effort: Pulmonary effort is normal.     Breath sounds: Normal breath sounds. No wheezing, rhonchi or rales.  Musculoskeletal:     Cervical back: Neck supple.  Skin:    General: Skin is warm.     Findings: Rash present.     Comments: One erythematous papular rash on left medial calf area. Multiple circular skin discoloration and excoriation marks on upper extremities b/l.   Neurological:     Mental Status: Cheyenne Church is alert and oriented to person, place, and time.   Psychiatric:        Behavior: Behavior normal.   The plan was reviewed with the patient/family, and all questions/concerned were addressed.  It was my pleasure to see Cheyenne Church today and participate in her care. Please feel free to contact me with any questions or concerns.  Sincerely,  Wyline Mood, DO Allergy & Immunology  [facility] ([facility]) office: [phone] [facility] office: [phone]

## 2023-04-23 ENCOUNTER — Ambulatory Visit (INDEPENDENT_AMBULATORY_CARE_PROVIDER_SITE_OTHER): Payer: Managed Care, Other (non HMO) | Admitting: Allergy

## 2023-04-23 ENCOUNTER — Encounter: Payer: Self-pay | Admitting: Allergy

## 2023-04-23 ENCOUNTER — Other Ambulatory Visit: Payer: Self-pay

## 2023-04-23 VITALS — BP 120/70 | HR 84 | Temp 98.4°F | Resp 18 | Ht 65.75 in | Wt 172.4 lb

## 2023-04-23 DIAGNOSIS — E739 Lactose intolerance, unspecified: Secondary | ICD-10-CM

## 2023-04-23 DIAGNOSIS — R21 Rash and other nonspecific skin eruption: Secondary | ICD-10-CM | POA: Diagnosis not present

## 2023-04-23 DIAGNOSIS — J3089 Other allergic rhinitis: Secondary | ICD-10-CM

## 2023-04-23 DIAGNOSIS — L299 Pruritus, unspecified: Secondary | ICD-10-CM | POA: Diagnosis not present

## 2023-04-23 MED ORDER — HYDROXYZINE HCL 10 MG PO TABS: 10.0000 mg | ORAL_TABLET | Freq: Three times a day (TID) | ORAL | 1 refills | Status: DC | PRN

## 2023-04-23 NOTE — Patient Instructions (Addendum)
Rash  Etiology unclear. Today's rash is not hives.  May take hydroxyzine 10mg  three times per day as needed for itching. Use clobetasol sparingly as needed. Do not use on the face, neck, armpits or groin area. Do not use more than 3 weeks in a row.  Avoid the following potential triggers: alcohol, tight clothing, NSAIDs, hot showers and getting overheated. See below for proper skin care. Use fragrance free and dye free products. No dryer sheets or fabric softener.   Get bloodwork. We are ordering labs, so please allow 1-2 weeks for the results to come back. With the newly implemented Cures Act, the labs might be visible to you at the same time that they become visible to me. However, I will not address the results until all of the results are back, so please be patient.   Recommend dermatology evaluation as well. Stop taking collagen supplement.   Environmental allergies Start environmental control measures as below for pet dander.  Return for allergy testing depending on bloodwork results.  Lactose intolerance May use lactose free milk or take a lactaid pill right before consuming anything with dairy.  Return in about 3 months (around 07/24/2023). Or sooner if needed.  Skin care recommendations  Bath time: Always use lukewarm water. AVOID very hot or cold water. Keep bathing time to 5-10 minutes. Do NOT use bubble bath. Use a mild soap and use just enough to wash the dirty areas. Do NOT scrub skin vigorously.  After bathing, pat dry your skin with a towel. Do NOT rub or scrub the skin.  Moisturizers and prescriptions:  ALWAYS apply moisturizers immediately after bathing (within 3 minutes). This helps to lock-in moisture. Use the moisturizer several times a day over the whole body. Good summer moisturizers include: Aveeno, CeraVe, Cetaphil. Good winter moisturizers include: Aquaphor, Vaseline, Cerave, Cetaphil, Eucerin, Vanicream. When using moisturizers along with medications,  the moisturizer should be applied about one hour after applying the medication to prevent diluting effect of the medication or moisturize around where you applied the medications. When not using medications, the moisturizer can be continued twice daily as maintenance.  Laundry and clothing: Avoid laundry products with added color or perfumes. Use unscented hypo-allergenic laundry products such as Tide free, Cheer free & gentle, and All free and clear.  If the skin still seems dry or sensitive, you can try double-rinsing the clothes. Avoid tight or scratchy clothing such as wool. Do not use fabric softeners or dyer sheets.   Pet Allergen Avoidance: Contrary to popular opinion, there are no "hypoallergenic" breeds of dogs or cats. That is because people are not allergic to an animal's hair, but to an allergen found in the animal's saliva, dander (dead skin flakes) or urine. Pet allergy symptoms typically occur within minutes. For some people, symptoms can build up and become most severe 8 to 12 hours after contact with the animal. People with severe allergies can experience reactions in public places if dander has been transported on the pet owners' clothing. Keeping an animal outdoors is only a partial solution, since homes with pets in the yard still have higher concentrations of animal allergens. Before getting a pet, ask your allergist to determine if you are allergic to animals. If your pet is already considered part of your family, try to minimize contact and keep the pet out of the bedroom and other rooms where you spend a great deal of time. As with dust mites, vacuum carpets often or replace carpet with a hardwood floor,  tile or linoleum. High-efficiency particulate air (HEPA) cleaners can reduce allergen levels over time. While dander and saliva are the source of cat and dog allergens, urine is the source of allergens from rabbits, hamsters, mice and [place] pigs; so ask a non-allergic family  member to clean the animal's cage. If you have a pet allergy, talk to your allergist about the potential for allergy immunotherapy (allergy shots). This strategy can often provide long-term relief.

## 2023-04-25 LAB — CBC WITH DIFFERENTIAL/PLATELET
Basophils Absolute: 0.1 10*3/uL (ref 0.0–0.2)
Basos: 1 %
EOS (ABSOLUTE): 0.1 10*3/uL (ref 0.0–0.4)
Eos: 2 %
Hematocrit: 48.7 % — ABNORMAL HIGH (ref 34.0–46.6)
Hemoglobin: 15.6 g/dL (ref 11.1–15.9)
Immature Grans (Abs): 0 10*3/uL (ref 0.0–0.1)
Immature Granulocytes: 0 %
Lymphocytes Absolute: 2.9 10*3/uL (ref 0.7–3.1)
Lymphs: 34 %
MCH: 26.9 pg (ref 26.6–33.0)
MCHC: 32 g/dL (ref 31.5–35.7)
MCV: 84 fL (ref 79–97)
Monocytes Absolute: 0.4 10*3/uL (ref 0.1–0.9)
Monocytes: 4 %
Neutrophils Absolute: 5.1 10*3/uL (ref 1.4–7.0)
Neutrophils: 59 %
Platelets: 284 10*3/uL (ref 150–450)
RBC: 5.81 x10E6/uL — ABNORMAL HIGH (ref 3.77–5.28)
RDW: 14.1 % (ref 11.7–15.4)
WBC: 8.6 10*3/uL (ref 3.4–10.8)

## 2023-04-25 LAB — COMPREHENSIVE METABOLIC PANEL
ALT: 47 [IU]/L — ABNORMAL HIGH (ref 0–32)
AST: 35 [IU]/L (ref 0–40)
Albumin: 4.8 g/dL (ref 3.8–4.9)
Alkaline Phosphatase: 67 [IU]/L (ref 44–121)
BUN/Creatinine Ratio: 23 (ref 9–23)
BUN: 20 mg/dL (ref 6–24)
Bilirubin Total: 0.5 mg/dL (ref 0.0–1.2)
CO2: 25 mmol/L (ref 20–29)
Calcium: 10.6 mg/dL — ABNORMAL HIGH (ref 8.7–10.2)
Chloride: 98 mmol/L (ref 96–106)
Creatinine, Ser: 0.87 mg/dL (ref 0.57–1.00)
Globulin, Total: 2 g/dL (ref 1.5–4.5)
Glucose: 91 mg/dL (ref 70–99)
Potassium: 4.2 mmol/L (ref 3.5–5.2)
Sodium: 139 mmol/L (ref 134–144)
Total Protein: 6.8 g/dL (ref 6.0–8.5)
eGFR: 79 mL/min/{1.73_m2} (ref >59)

## 2023-04-25 LAB — ALPHA-GAL PANEL
Allergen Lamb IgE: <0.1 kU/L
Beef IgE: <0.1 kU/L
IgE (Immunoglobulin E), Serum: 18 [IU]/mL (ref 6–495)
O215-IgE Alpha-Gal: <0.1 kU/L
Pork IgE: <0.1 kU/L

## 2023-04-25 LAB — ANA W/REFLEX: Anti Nuclear Antibody (ANA): NEGATIVE

## 2023-04-25 LAB — TRYPTASE: Tryptase: 3.4 ug/L (ref 2.2–13.2)

## 2023-04-25 LAB — THYROID CASCADE PROFILE: TSH: 2.2 u[IU]/mL (ref 0.450–4.500)

## 2023-04-25 LAB — SEDIMENTATION RATE: Sed Rate: 2 mm/h (ref 0–40)

## 2023-04-25 LAB — C-REACTIVE PROTEIN: CRP: 5 mg/L (ref 0–10)

## 2023-06-02 ENCOUNTER — Other Ambulatory Visit: Payer: Self-pay | Admitting: Family Medicine

## 2023-06-02 DIAGNOSIS — Z1231 Encounter for screening mammogram for malignant neoplasm of breast: Secondary | ICD-10-CM

## 2023-07-11 ENCOUNTER — Inpatient Hospital Stay: Payer: Managed Care, Other (non HMO)

## 2023-07-11 ENCOUNTER — Encounter: Payer: Self-pay | Admitting: Internal Medicine

## 2023-07-11 ENCOUNTER — Inpatient Hospital Stay: Payer: Managed Care, Other (non HMO) | Attending: Internal Medicine | Admitting: Internal Medicine

## 2023-07-11 VITALS — BP 106/71 | HR 76 | Temp 97.6°F | Resp 18 | Wt 171.0 lb

## 2023-07-11 DIAGNOSIS — R718 Other abnormality of red blood cells: Secondary | ICD-10-CM

## 2023-07-11 DIAGNOSIS — I10 Essential (primary) hypertension: Secondary | ICD-10-CM | POA: Insufficient documentation

## 2023-07-11 DIAGNOSIS — L299 Pruritus, unspecified: Secondary | ICD-10-CM | POA: Diagnosis not present

## 2023-07-11 LAB — COMPREHENSIVE METABOLIC PANEL
ALT: 51 U/L — ABNORMAL HIGH (ref 0–44)
AST: 37 U/L (ref 15–41)
Albumin: 4.5 g/dL (ref 3.5–5.0)
Alkaline Phosphatase: 76 U/L (ref 38–126)
Anion gap: 10 (ref 5–15)
BUN: 10 mg/dL (ref 6–20)
CO2: 26 mmol/L (ref 22–32)
Calcium: 10.3 mg/dL (ref 8.9–10.3)
Chloride: 101 mmol/L (ref 98–111)
Creatinine, Ser: 0.86 mg/dL (ref 0.44–1.00)
GFR, Estimated: >60 mL/min (ref >60)
Glucose, Bld: 100 mg/dL — ABNORMAL HIGH (ref 70–99)
Potassium: 4.6 mmol/L (ref 3.5–5.1)
Sodium: 137 mmol/L (ref 135–145)
Total Bilirubin: 0.8 mg/dL (ref <1.2)
Total Protein: 7.7 g/dL (ref 6.5–8.1)

## 2023-07-11 LAB — CBC WITH DIFFERENTIAL/PLATELET
Abs Immature Granulocytes: 0.03 10*3/uL (ref 0.00–0.07)
Basophils Absolute: 0 10*3/uL (ref 0.0–0.1)
Basophils Relative: 0 %
Eosinophils Absolute: 0.3 10*3/uL (ref 0.0–0.5)
Eosinophils Relative: 3 %
HCT: 44.3 % (ref 36.0–46.0)
Hemoglobin: 14.4 g/dL (ref 12.0–15.0)
Immature Granulocytes: 0 %
Lymphocytes Relative: 29 %
Lymphs Abs: 2.8 10*3/uL (ref 0.7–4.0)
MCH: 26.6 pg (ref 26.0–34.0)
MCHC: 32.5 g/dL (ref 30.0–36.0)
MCV: 81.7 fL (ref 80.0–100.0)
Monocytes Absolute: 0.5 10*3/uL (ref 0.1–1.0)
Monocytes Relative: 5 %
Neutro Abs: 6.2 10*3/uL (ref 1.7–7.7)
Neutrophils Relative %: 63 %
Platelets: 327 10*3/uL (ref 150–400)
RBC: 5.42 MIL/uL — ABNORMAL HIGH (ref 3.87–5.11)
RDW: 13.6 % (ref 11.5–15.5)
WBC: 9.9 10*3/uL (ref 4.0–10.5)
nRBC: 0 % (ref 0.0–0.2)

## 2023-07-11 NOTE — Progress Notes (Signed)
Patient has factor 5 issues in family. Recent lab work showed  a lot of things were abnormal and elevated. About a year now she has some shortness of breath with exertion.

## 2023-07-11 NOTE — Progress Notes (Signed)
[facility]  [phone] [phone]  ID: Cheyenne Church OB: 01/03/1966  MR#: [id number]  UVO#:[id number]  Patient Care Team: Leim Fabry, MD as PCP - General (Family Medicine)  REFERRING PROVIDER: Leim Fabry  REASON FOR REFERRAL: Abnormal CBC  HPI: Cheyenne Church is a 57 y.o. female with past medical history of hypertension referred to hematology for abnormal CBC.  Labs reviewed.  CBC from 04/23/2023 showed WBC 8.6, hemoglobin 15.6 hematocrit 48.7 and platelets 284.  Repeat CBC from 05/30/2023 showed hematocrit 48.3.  Previously has been normal.  Family hx of blood clot in sister. Maternal side Factor V Leiden. No personal hx of blood clot  Allergy and dermatology - Pruritus non specific rash. 5 years ago. Intermittent initially. Last Feb 2024 sores came and got worse. On hydroxyzine for symptom relief.  Second hand smoke - father. No hx of personal smoking  Snoring and weight gain. Sob with exercise.  No headache. Little dizzy. 1 year ago syncope  REVIEW OF SYSTEMS:   ROS  As per HPI. Otherwise, a complete review of systems is negative.  PAST MEDICAL HISTORY: Past Medical History:  Diagnosis Date   Hypertension     PAST SURGICAL HISTORY: Past Surgical History:  Procedure Laterality Date   CESAREAN SECTION      FAMILY HISTORY: Family History  Problem Relation Age of Onset   Breast cancer Paternal Aunt 4       2 pat aunts    HEALTH MAINTENANCE: Social History   Tobacco Use   Smoking status: Never    Passive exposure: Never   Smokeless tobacco: Never  Vaping Use   Vaping status: Never Used  Substance Use Topics   Alcohol use: Yes   Drug use: Never     Allergies  Allergen Reactions   Erythromycin Other (See Comments)    Years ago- felt like heartburn   Codeine Itching and Other (See Comments)    Itchy    Current Outpatient Medications  Medication Sig Dispense Refill   clobetasol cream  (TEMOVATE) 0.05 % Apply 1 Application topically 2 (two) times daily.     hydrOXYzine (ATARAX) 10 MG tablet Take 1 tablet (10 mg total) by mouth 3 (three) times daily as needed for itching. 60 tablet 1   meloxicam (MOBIC) 15 MG tablet Take 15 mg by mouth daily.     Multiple Vitamin (MULTIVITAMIN) tablet Take 1 tablet by mouth daily.     olmesartan (BENICAR) 20 MG tablet Take 20 mg by mouth daily.     Omega-3 Fatty Acids (FISH OIL PO) Take 1 capsule by mouth daily.     No current facility-administered medications for this visit.    OBJECTIVE: There were no vitals filed for this visit.   There is no height or weight on file to calculate BMI.      General: Well-developed, well-nourished, no acute distress. Eyes: Pink conjunctiva, anicteric sclera. HEENT: Normocephalic, moist mucous membranes, clear oropharnyx. Lungs: Clear to auscultation bilaterally. Heart: Regular rate and rhythm. No rubs, murmurs, or gallops. Abdomen: Soft, nontender, nondistended. No organomegaly noted, normoactive bowel sounds. Musculoskeletal: No edema, cyanosis, or clubbing. Neuro: Alert, answering all questions appropriately. Cranial nerves grossly intact. Skin: No rashes or petechiae noted. Psych: Normal affect. Lymphatics: No cervical, calvicular, axillary or inguinal LAD.   LAB RESULTS:  Lab Results  Component Value Date   NA 139 04/23/2023   K 4.2 04/23/2023   CL 98 04/23/2023   CO2 25 04/23/2023   GLUCOSE  91 04/23/2023   BUN 20 04/23/2023   CREATININE 0.87 04/23/2023   CALCIUM 10.6 (H) 04/23/2023   PROT 6.8 04/23/2023   ALBUMIN 4.8 04/23/2023   AST 35 04/23/2023   ALT 47 (H) 04/23/2023   ALKPHOS 67 04/23/2023   BILITOT 0.5 04/23/2023   GFRNONAA >60 05/26/2022   GFRAA >60 08/30/2013    Lab Results  Component Value Date   WBC 8.6 04/23/2023   NEUTROABS 5.1 04/23/2023   HGB 15.6 04/23/2023   HCT 48.7 (H) 04/23/2023   MCV 84 04/23/2023   PLT 284 04/23/2023    No results found for:  "TIBC", "FERRITIN", "IRONPCTSAT"   STUDIES: No results found.  ASSESSMENT AND PLAN:   Cheyenne Church is a 57 y.o. female with pmh of hypertension referred to hematology for abnormal CBC.  # Elevated hematocrit -Unknown etiology.  First detected in October 2024 hematocrit of 48.7.  WBC, hemoglobin and platelets are normal.  Repeat check in November 2024 hematocrit 48.3. -Patient denies any personal history of smoking.  Medication list reviewed and no offending agents identified that can elevate hematocrit. -Recently had weight gain and reports snoring.  Never had sleep study.  Referral placed to pulmonary to further evaluate for OSA which can cause secondary elevation in hematocrit. -Check CBC, CMP, MPN mutational analysis and EPO level to rule out MPN's.  Follow-up in 3 weeks via video visit to discuss labs.  # History of nonspecific rash and pruritus -For 5 years.  Follows with dermatology and allergist. Wax and wanes.  Does not have a specific diagnosis. -Uses hydroxyzine for symptom relief.  Orders Placed This Encounter  Procedures   CBC with Differential/Platelet   Comprehensive metabolic panel   JAK2 [id number] rfx CALR/MPL/E12-15   Erythropoietin   Ambulatory referral to Pulmonology   RTC 3 weeks video visit.   Patient expressed understanding and was in agreement with this plan. She also understands that She can call clinic at any time with any questions, concerns, or complaints.   I spent a total of 45 minutes reviewing chart data, face-to-face evaluation with the patient, counseling and coordination of care as detailed above.  Michaelyn Barter, MD   07/11/2023 10:52 AM

## 2023-07-12 LAB — ERYTHROPOIETIN: Erythropoietin: 7.3 m[IU]/mL (ref 2.6–18.5)

## 2023-07-22 LAB — JAK2 V617F RFX CALR/MPL/E12-15

## 2023-07-22 LAB — CALR +MPL + E12-E15  (REFLEX)

## 2023-07-29 NOTE — Progress Notes (Signed)
 Follow Up Note  RE: Cheyenne Church MRN: [id number] DOB: 1965/11/26 Date of Office Visit: 07/30/2023  Referring provider: Jyl Railing, MD Primary care provider: Jyl Railing, MD  Chief Complaint: Follow-up, Rash (No concerns), and Pruritus (Still having issues with itching.)  History of Present Illness: I had the pleasure of seeing Cheyenne Church for a follow up visit at the [facility] on 07/31/2023. She is a 58 y.o. female, who is being followed for pruritic rash, allergic rhinitis. Her previous allergy office visit was on 04/23/2023 with Dr. Luke. Today is a regular follow up visit.  Discussed the use of AI scribe software for clinical note transcription with the patient, who gave verbal consent to proceed.  The patient, with a history of chronic pruritus, presents for follow-up after hematological evaluation. She reports persistent itching, which has been somewhat controlled with daily hydroxyzine  10mg . However, a recent lapse in medication led to a resurgence of symptoms. The patient also takes over-the-counter Allegra 180mg  in the mornings. The itching is worse in the summer and possibly exacerbated by heat, but the exact trigger remains unclear. The patient has also been using a prescribed lidocaine numbing cream at night, which provides some relief. This was prescribed by her dermatologist.   The patient has seen a dermatologist, who suggested a diagnosis of Purigo and offered a medication regimen with potential side effects. The patient declined this treatment, feeling her symptoms were not severe enough to warrant it. She has also considered the possibility of allergies, both to her dog and certain foods, as potential triggers for her symptoms. However, allergy testing has not been pursued at this time as patient declined.   The patient's hematological workup revealed slightly elevated blood cell counts, which have decreased slightly from previous levels. She  is due to discuss the other lab results with her hematologist via video call.      07/11/2023 heme/onc visit: # Elevated hematocrit -Unknown etiology.  First detected in October 2024 hematocrit of 48.7.  WBC, hemoglobin and platelets are normal.  Repeat check in November 2024 hematocrit 48.3. -Patient denies any personal history of smoking.  Medication list reviewed and no offending agents identified that can elevate hematocrit. -Recently had weight gain and reports snoring.  Never had sleep study.  Referral placed to pulmonary to further evaluate for OSA which can cause secondary elevation in hematocrit. -Check CBC, CMP, MPN mutational analysis and EPO level to rule out MPN's.  Follow-up in 3 weeks via video visit to discuss labs.   # History of nonspecific rash and pruritus -For 5 years.  Follows with dermatology and allergist. Wax and wanes.  Does not have a specific diagnosis. -Uses hydroxyzine  for symptom relief.  Assessment and Plan: Cheyenne Church is a 58 y.o. female with: Rash and other nonspecific skin eruption Pruritus Past history - Pruritus for approximately 7 years with new onset of rash since February. The rash is characterized by itchy bumps that persist for weeks and move around. The rash has been partially responsive to Clobetasol cream and Hydroxyzine . No clear etiology identified yet. Interim history - Persistent itching despite use of hydroxyzine  10mg  daily and topical lidocaine. Possible triggers include heat, stress, and potential dog allergy. Heme/onc evaluation ongoing. 2024 bloodwork - slightly elevated RBCs, ALT, rest were normal. Derm recommended some type of medication but patient denied due to side effect profile.  Etiology unclear.  May take hydroxyzine  10mg  three times per day as needed for itching. May take allegra 180mg   in the morning as needed.  Avoid the following potential triggers: alcohol, tight clothing, NSAIDs, hot showers and getting overheated. Continue  proper skin care.    Other allergic rhinitis Past history - perennial symptoms. No prior allergy testing. Declined environmental skin testing.  Continue environmental control measures as below for pet dander.  Recommend allergy testing next.  Return if symptoms worsen or fail to improve.  No orders of the defined types were placed in this encounter.  Lab Orders  No laboratory test(s) ordered today    Diagnostics: None.   Medication List:  Current Outpatient Medications  Medication Sig Dispense Refill   hydrOXYzine  (ATARAX ) 10 MG tablet Take 1 tablet (10 mg total) by mouth 3 (three) times daily as needed for itching. 60 tablet 1   meloxicam (MOBIC) 15 MG tablet Take 15 mg by mouth daily.     olmesartan (BENICAR) 20 MG tablet Take 20 mg by mouth daily.     No current facility-administered medications for this visit.   Allergies: Allergies  Allergen Reactions   Erythromycin Other (See Comments)    Years ago- felt like heartburn   Codeine Itching and Other (See Comments)    Itchy   I reviewed her past medical history, social history, family history, and environmental history and no significant changes have been reported from her previous visit.  Review of Systems  Constitutional:  Negative for appetite change, chills, fever and unexpected weight change.  HENT:  Negative for congestion and rhinorrhea.   Eyes:  Negative for itching.  Respiratory:  Negative for cough, chest tightness, shortness of breath and wheezing.   Cardiovascular:  Negative for chest pain.  Gastrointestinal:  Negative for abdominal pain.  Genitourinary:  Negative for difficulty urinating.  Skin:  Positive for rash.       pruritus  Neurological:  Negative for headaches.    Objective: BP 110/60   Pulse 71   Temp 98 F (36.7 C)   Resp 16   Ht 5' 5.75 (1.67 m)   Wt 170 lb 1.6 oz (77.2 kg)   SpO2 98%   BMI 27.66 kg/m  Body mass index is 27.66 kg/m. Physical Exam Vitals and nursing note  reviewed.  Constitutional:      Appearance: Normal appearance. She is well-developed.  HENT:     Head: Normocephalic and atraumatic.     Right Ear: External ear normal.     Left Ear: External ear normal.  Eyes:     Conjunctiva/sclera: Conjunctivae normal.  Cardiovascular:     Rate and Rhythm: Normal rate and regular rhythm.     Heart sounds: Normal heart sounds. No murmur heard.    No friction rub. No gallop.  Pulmonary:     Effort: Pulmonary effort is normal.     Breath sounds: Normal breath sounds. No wheezing, rhonchi or rales.  Musculoskeletal:     Cervical back: Neck supple.  Skin:    General: Skin is warm.     Findings: Rash present.     Comments: Multiple circular skin discoloration and excoriation marks on upper extremities b/l.   Neurological:     Mental Status: She is alert and oriented to person, place, and time.  Psychiatric:        Behavior: Behavior normal.    Previous notes and tests were reviewed. The plan was reviewed with the patient/family, and all questions/concerned were addressed.  It was my pleasure to see Sincere today and participate in her care. Please feel free to contact  me with any questions or concerns.  Sincerely,  Orlan Cramp, DO Allergy & Immunology  [facility]  [facility] office: [phone] [facility] office: [phone]

## 2023-07-30 ENCOUNTER — Other Ambulatory Visit: Payer: Self-pay

## 2023-07-30 ENCOUNTER — Ambulatory Visit
Admission: RE | Admit: 2023-07-30 | Discharge: 2023-07-30 | Disposition: A | Discharge status: Home / Self Care | Payer: Managed Care, Other (non HMO) | Source: Ambulatory Visit | Attending: Family Medicine | Admitting: Family Medicine

## 2023-07-30 ENCOUNTER — Ambulatory Visit (INDEPENDENT_AMBULATORY_CARE_PROVIDER_SITE_OTHER): Payer: Managed Care, Other (non HMO) | Admitting: Allergy

## 2023-07-30 VITALS — BP 110/60 | HR 71 | Temp 98.0°F | Resp 16 | Ht 65.75 in | Wt 170.1 lb

## 2023-07-30 DIAGNOSIS — Z1231 Encounter for screening mammogram for malignant neoplasm of breast: Secondary | ICD-10-CM | POA: Insufficient documentation

## 2023-07-30 DIAGNOSIS — R21 Rash and other nonspecific skin eruption: Secondary | ICD-10-CM

## 2023-07-30 DIAGNOSIS — J3089 Other allergic rhinitis: Secondary | ICD-10-CM | POA: Diagnosis not present

## 2023-07-30 DIAGNOSIS — L299 Pruritus, unspecified: Secondary | ICD-10-CM | POA: Diagnosis not present

## 2023-07-30 NOTE — Patient Instructions (Addendum)
 Rash  Etiology unclear.  May take hydroxyzine  10mg  three times per day as needed for itching. May take allegra 180mg  in the morning as needed.  Avoid the following potential triggers: alcohol, tight clothing, NSAIDs, hot showers and getting overheated. Continue proper ski ncare.   Environmental allergies Continue environmental control measures as below for pet dander.  Recommend allergy testing next.  Skin testing instructions.  Make sure you don't take any antihistamines for 3 days before the skin testing appointment. No allegra, hydroxyzine .  Don't put any lotion on the back and arms on the day of testing.  Plan on being here for 30-60 minutes.   Follow up as needed.   Skin care recommendations  Bath time: Always use lukewarm water. AVOID very hot or cold water. Keep bathing time to 5-10 minutes. Do NOT use bubble bath. Use a mild soap and use just enough to wash the dirty areas. Do NOT scrub skin vigorously.  After bathing, pat dry your skin with a towel. Do NOT rub or scrub the skin.  Moisturizers and prescriptions:  ALWAYS apply moisturizers immediately after bathing (within 3 minutes). This helps to lock-in moisture. Use the moisturizer several times a day over the whole body. Good summer moisturizers include: Aveeno, CeraVe, Cetaphil. Good winter moisturizers include: Aquaphor, Vaseline, Cerave, Cetaphil, Eucerin, Vanicream. When using moisturizers along with medications, the moisturizer should be applied about one hour after applying the medication to prevent diluting effect of the medication or moisturize around where you applied the medications. When not using medications, the moisturizer can be continued twice daily as maintenance.  Laundry and clothing: Avoid laundry products with added color or perfumes. Use unscented hypo-allergenic laundry products such as Tide free, Cheer free & gentle, and All free and clear.  If the skin still seems dry or sensitive, you can  try double-rinsing the clothes. Avoid tight or scratchy clothing such as wool. Do not use fabric softeners or dyer sheets.   Pet Allergen Avoidance: Contrary to popular opinion, there are no "hypoallergenic" breeds of dogs or cats. That is because people are not allergic to an animal's hair, but to an allergen found in the animal's saliva, dander (dead skin flakes) or urine. Pet allergy symptoms typically occur within minutes. For some people, symptoms can build up and become most severe 8 to 12 hours after contact with the animal. People with severe allergies can experience reactions in public places if dander has been transported on the pet owners' clothing. Keeping an animal outdoors is only a partial solution, since homes with pets in the yard still have higher concentrations of animal allergens. Before getting a pet, ask your allergist to determine if you are allergic to animals. If your pet is already considered part of your family, try to minimize contact and keep the pet out of the bedroom and other rooms where you spend a great deal of time. As with dust mites, vacuum carpets often or replace carpet with a hardwood floor, tile or linoleum. High-efficiency particulate air (HEPA) cleaners can reduce allergen levels over time. While dander and saliva are the source of cat and dog allergens, urine is the source of allergens from rabbits, hamsters, mice and guinea pigs; so ask a non-allergic family member to clean the animal's cage. If you have a pet allergy, talk to your allergist about the potential for allergy immunotherapy (allergy shots). This strategy can often provide long-term relief.

## 2023-07-31 ENCOUNTER — Inpatient Hospital Stay
Admission: RE | Admit: 2023-07-31 | Discharge: 2023-07-31 | Disposition: A | Discharge status: Home / Self Care | Payer: Self-pay | Source: Ambulatory Visit | Attending: Family Medicine | Admitting: Family Medicine

## 2023-07-31 ENCOUNTER — Other Ambulatory Visit: Payer: Self-pay | Admitting: *Deleted

## 2023-07-31 ENCOUNTER — Encounter: Payer: Self-pay | Admitting: Allergy

## 2023-07-31 DIAGNOSIS — Z1231 Encounter for screening mammogram for malignant neoplasm of breast: Secondary | ICD-10-CM

## 2023-08-01 ENCOUNTER — Inpatient Hospital Stay: Payer: Managed Care, Other (non HMO) | Admitting: Internal Medicine

## 2023-08-01 ENCOUNTER — Inpatient Hospital Stay: Payer: Managed Care, Other (non HMO) | Attending: Internal Medicine | Admitting: Internal Medicine

## 2023-08-01 ENCOUNTER — Encounter: Payer: Self-pay | Admitting: Internal Medicine

## 2023-08-01 DIAGNOSIS — R718 Other abnormality of red blood cells: Secondary | ICD-10-CM

## 2023-08-01 NOTE — Progress Notes (Signed)
 [facility]  Telephone:([phone] Fax:([phone]) [phone]  I connected with Cheyenne Church on 08/01/23 at 11:50 AM EST by my chart video and verified that I am speaking with the correct person using two identifiers.   I discussed the limitations, risks, security and privacy concerns of performing an evaluation and management service by telemedicine and the availability of in-person appointments. I also discussed with the patient that there may be a patient responsible charge related to this service. The patient expressed understanding and agreed to proceed.   Other persons participating in the visit and their role in the encounter: none   Patient's location: car  Provider's location: clinic   Chief Complaint: disucss labs  ID: Cheyenne Church OB: Oct 15, 1965  MR#: [id number]  RDW#:[id number]  Patient Care Team: Cheyenne Railing, MD as PCP - General (Family Medicine) Cheyenne Bimler, MD as Consulting Physician (Oncology)  REFERRING PROVIDER: Railing Cheyenne  REASON FOR REFERRAL: Abnormal CBC  HPI: Cheyenne Church is a 58 y.o. female with past medical history of hypertension referred to hematology for abnormal CBC.  Labs reviewed.  CBC from 04/23/2023 showed WBC 8.6, hemoglobin 15.6 hematocrit 48.7 and platelets 284.  Repeat CBC from 05/30/2023 showed hematocrit 48.3.  Previously has been normal.  Family hx of blood clot in sister. Maternal side Factor V Leiden. No personal hx of blood clot  Allergy and dermatology - Pruritus non specific rash. 5 years ago. Intermittent initially. Last Feb 2024 sores came and got worse. On hydroxyzine  for symptom relief.  Second hand smoke - father. No hx of personal smoking  Snoring and weight gain. Sob with exercise.  No headache. Little dizzy. 1 year ago syncope  Interval history Connected with the patient via MyChart video visit. She is doing well overall.  Denies any new concerns.  REVIEW OF SYSTEMS:   ROS  As per  HPI. Otherwise, a complete review of systems is negative.  PAST MEDICAL HISTORY: Past Medical History:  Diagnosis Date   Hypertension     PAST SURGICAL HISTORY: Past Surgical History:  Procedure Laterality Date   CESAREAN SECTION      FAMILY HISTORY: Family History  Problem Relation Age of Onset   Thyroid  disease Mother    Hypertension Mother    Heart disease Father    Pulmonary fibrosis Father    Breast cancer Paternal Aunt 57       2 pat aunts    HEALTH MAINTENANCE: Social History   Tobacco Use   Smoking status: Never    Passive exposure: Never   Smokeless tobacco: Never  Vaping Use   Vaping status: Never Used  Substance Use Topics   Alcohol use: Yes   Drug use: Never     Allergies  Allergen Reactions   Erythromycin Other (See Comments)    Years ago- felt like heartburn   Codeine Itching and Other (See Comments)    Itchy    Current Outpatient Medications  Medication Sig Dispense Refill   hydrOXYzine  (ATARAX ) 10 MG tablet Take 1 tablet (10 mg total) by mouth 3 (three) times daily as needed for itching. 60 tablet 1   meloxicam (MOBIC) 15 MG tablet Take 15 mg by mouth daily.     olmesartan (BENICAR) 20 MG tablet Take 20 mg by mouth daily.     No current facility-administered medications for this visit.    OBJECTIVE: There were no vitals filed for this visit.   There is no height or weight on file to calculate  BMI.      Physical exam not performed.  Virtual visit.  LAB RESULTS:  Lab Results  Component Value Date   NA 137 07/11/2023   K 4.6 07/11/2023   CL 101 07/11/2023   CO2 26 07/11/2023   GLUCOSE 100 (H) 07/11/2023   BUN 10 07/11/2023   CREATININE 0.86 07/11/2023   CALCIUM 10.3 07/11/2023   PROT 7.7 07/11/2023   ALBUMIN 4.5 07/11/2023   AST 37 07/11/2023   ALT 51 (H) 07/11/2023   ALKPHOS 76 07/11/2023   BILITOT 0.8 07/11/2023   GFRNONAA >60 07/11/2023   GFRAA >60 08/30/2013    Lab Results  Component Value Date   WBC 9.9 07/11/2023    NEUTROABS 6.2 07/11/2023   HGB 14.4 07/11/2023   HCT 44.3 07/11/2023   MCV 81.7 07/11/2023   PLT 327 07/11/2023    No results found for: TIBC, FERRITIN, IRONPCTSAT   STUDIES: MM 3D SCREENING MAMMOGRAM BILATERAL BREAST Result Date: 07/31/2023 CLINICAL DATA:  Screening. EXAM: DIGITAL SCREENING BILATERAL MAMMOGRAM WITH TOMOSYNTHESIS AND CAD TECHNIQUE: Bilateral screening digital craniocaudal and mediolateral oblique mammograms were obtained. Bilateral screening digital breast tomosynthesis was performed. The images were evaluated with computer-aided detection. COMPARISON:  Previous exam(s). ACR Breast Density Category a: The breasts are almost entirely fatty. FINDINGS: There are no findings suspicious for malignancy. IMPRESSION: No mammographic evidence of malignancy. A result letter of this screening mammogram will be mailed directly to the patient. RECOMMENDATION: Screening mammogram in one year. (Code:SM-B-01Y) BI-RADS CATEGORY  1: Negative. Electronically Signed   By: Delon Music M.D.   On: 07/31/2023 13:52   MM Outside Films Mammo Result Date: 07/31/2023 This examination belongs to an outside facility and is stored here for comparison purposes only.  Contact the originating outside institution for any associated report or interpretation.  MM Outside Films Mammo Result Date: 07/31/2023 This examination belongs to an outside facility and is stored here for comparison purposes only.  Contact the originating outside institution for any associated report or interpretation.  MM Outside Films Mammo Result Date: 07/31/2023 This examination belongs to an outside facility and is stored here for comparison purposes only.  Contact the originating outside institution for any associated report or interpretation.  MM Outside Films Mammo Result Date: 07/31/2023 This examination belongs to an outside facility and is stored here for comparison purposes only.  Contact the originating outside  institution for any associated report or interpretation.   ASSESSMENT AND PLAN:   Cheyenne Church is a 58 y.o. female with pmh of hypertension referred to hematology for abnormal CBC.  # Elevated hematocrit -Could be secondary.  First detected in October 2024 hematocrit of 48.7.  WBC, hemoglobin and platelets are normal.  Repeat check in November 2024 hematocrit 48.3.  -EPO level is normal at 7.  JAK2 V6 55F/exon 12-15/ CALR/ MPL negative.  No concern for myeloproliferative neoplasm.  -Patient does report history of weight gain and snoring.  Referral was placed to pulmonary for sleep study.  Reports she missed the call and will think about scheduling.  Patient can continue to follow with her primary care doctor for routine blood work once or twice a year.  She can be referred back to hematology if there is any concerns with the blood count.    # History of nonspecific rash and pruritus -For 5 years.  Follows with dermatology and allergist. Wax and wanes.  Does not have a specific diagnosis. -Uses hydroxyzine  for symptom relief.  No orders of the defined  types were placed in this encounter.  RTC as needed  Patient expressed understanding and was in agreement with this plan. She also understands that She can call clinic at any time with any questions, concerns, or complaints.   I spent a total of 25 minutes reviewing chart data, face-to-face evaluation with the patient, counseling and coordination of care as detailed above.  Cheyenne Rous, MD   08/01/2023 12:59 PM

## 2023-08-01 NOTE — Progress Notes (Signed)
 Patient stated that she didn't know that her MyChart visit with Dr. Alena Bills at 2:30 pm today, so I asked her if she was going to be able to do her visit at 11:50 am today instead and she told me she should be able to, right now she is getting her hair done

## 2023-10-31 ENCOUNTER — Other Ambulatory Visit: Payer: Self-pay | Admitting: Allergy
